# Patient Record
Sex: Male | Born: 1970 | Race: White | Hispanic: No | Marital: Single | Smoking: Never smoker
Health system: Southern US, Community
[De-identification: ages and names within clinical notes are randomized; demographics above are authoritative.]

## PROBLEM LIST (undated history)

## (undated) DIAGNOSIS — F419 Anxiety disorder, unspecified: Secondary | ICD-10-CM

## (undated) HISTORY — DX: Anxiety disorder, unspecified: F41.9

---

## 1998-05-20 ENCOUNTER — Emergency Department (HOSPITAL_COMMUNITY): Admission: EM | Admit: 1998-05-20 | Discharge: 1998-05-20 | Payer: Self-pay | Admitting: Emergency Medicine

## 2000-02-08 ENCOUNTER — Encounter: Admission: RE | Admit: 2000-02-08 | Discharge: 2000-02-08 | Payer: Self-pay | Admitting: *Deleted

## 2011-01-20 DIAGNOSIS — R7989 Other specified abnormal findings of blood chemistry: Secondary | ICD-10-CM | POA: Insufficient documentation

## 2011-03-30 ENCOUNTER — Other Ambulatory Visit: Payer: Self-pay | Admitting: Chiropractic Medicine

## 2011-03-30 DIAGNOSIS — M545 Low back pain, unspecified: Secondary | ICD-10-CM

## 2011-04-02 ENCOUNTER — Ambulatory Visit
Admission: RE | Admit: 2011-04-02 | Discharge: 2011-04-02 | Disposition: A | Discharge status: Home / Self Care | Payer: Medicare HMO | Source: Ambulatory Visit | Attending: Chiropractic Medicine | Admitting: Chiropractic Medicine

## 2011-04-02 DIAGNOSIS — M545 Low back pain, unspecified: Secondary | ICD-10-CM

## 2013-03-11 ENCOUNTER — Other Ambulatory Visit: Payer: Self-pay | Admitting: Orthopedic Surgery

## 2013-03-11 ENCOUNTER — Other Ambulatory Visit: Payer: Medicare HMO

## 2013-03-11 DIAGNOSIS — G8929 Other chronic pain: Secondary | ICD-10-CM

## 2013-03-25 ENCOUNTER — Ambulatory Visit
Admission: RE | Admit: 2013-03-25 | Discharge: 2013-03-25 | Disposition: A | Discharge status: Home / Self Care | Payer: 59 | Source: Ambulatory Visit | Attending: Orthopedic Surgery | Admitting: Orthopedic Surgery

## 2013-03-25 DIAGNOSIS — M25551 Pain in right hip: Secondary | ICD-10-CM

## 2013-03-25 DIAGNOSIS — G8929 Other chronic pain: Secondary | ICD-10-CM

## 2015-05-29 ENCOUNTER — Ambulatory Visit (INDEPENDENT_AMBULATORY_CARE_PROVIDER_SITE_OTHER): Payer: 59

## 2015-05-29 ENCOUNTER — Ambulatory Visit (INDEPENDENT_AMBULATORY_CARE_PROVIDER_SITE_OTHER): Payer: 59 | Admitting: Family Medicine

## 2015-05-29 VITALS — BP 118/80 | HR 75 | Temp 97.8°F | Resp 16 | Ht 69.5 in | Wt 160.8 lb

## 2015-05-29 DIAGNOSIS — R0781 Pleurodynia: Secondary | ICD-10-CM | POA: Diagnosis not present

## 2015-05-29 DIAGNOSIS — M940 Chondrocostal junction syndrome [Tietze]: Secondary | ICD-10-CM

## 2015-05-29 MED ORDER — CYCLOBENZAPRINE HCL ER 15 MG PO CP24: 15.0000 mg | ORAL_CAPSULE | Freq: Every day | ORAL | Status: DC | PRN

## 2015-05-29 MED ORDER — DICLOFENAC SODIUM 75 MG PO TBEC: 75.0000 mg | DELAYED_RELEASE_TABLET | Freq: Two times a day (BID) | ORAL | Status: DC

## 2015-05-29 NOTE — Progress Notes (Signed)
[facility] [address], [place] [address] [phone]  Date:  05/29/2015   Name:  Andre Sullivan   DOB:  1971/05/21   MRN:  [id number]  PCP:  No primary care provider on file.    Chief Complaint: Shortness of Breath and pain on left side   History of Present Illness:  This is a 44 y.o. male who is presenting with left anterior chest pain and SOB of breath since this morning. States yesterday he was mountain biking and took a fall. The left side of his chest fell into his handle bars pretty hard. He states the breath was knocked out of him for a few minutes but afterwards he was able to finish his bike ride without problems. This morning he woke for a tennis lesson. He did 20 minutes of forehand exercises and 10 minutes of overhead exercises without pain. He then went to hit a backhand and felt sudden sharp pain in his left chest and collapsed on the court d/t pain. He started having SOB but this has resolved now. He does note it is painful to take a deep breath. Also worse with raising his arm. He denies wheezing. He has not tried anything for pain so far.  Review of Systems:  Review of Systems See HPI  Patient Active Problem List   Diagnosis Date Noted  . 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 Penicillins Other (See Comments)    Unknown reaction    History reviewed. No pertinent past surgical history.  History  Substance Use Topics  . Smoking status: Never Smoker   . Smokeless tobacco: Not on file  . Alcohol Use: Not on file    History reviewed. No pertinent family history.  Medication list has been reviewed and updated.  Physical Examination:  Physical Exam  Constitutional: He is oriented to person, place, and time. He appears well-developed and well-nourished. No distress.  HENT:  Head: Normocephalic and atraumatic.  Right Ear: Hearing normal.    Left Ear: Hearing normal.  Nose: Nose normal.  Eyes: Conjunctivae and lids are normal. Right eye exhibits no discharge. Left eye exhibits no discharge. No scleral icterus.  Cardiovascular: Normal rate, regular rhythm, normal heart sounds and normal pulses.   No murmur heard. Pulmonary/Chest: Effort normal and breath sounds normal. No tachypnea. No respiratory distress. He has no decreased breath sounds. He has no wheezes. He has no rhonchi. He has no rales. He exhibits tenderness (left anterior chest, 2 inches under clavicle), bony tenderness and swelling (3 cm swelling over tender area). He exhibits no deformity.    No ecchymosis or deformity Generally tender over sternum  Abdominal: Soft. Normal appearance. There is no tenderness.  Neurological: He is alert and oriented to person, place, and time.  Skin: Skin is warm, dry and intact. No lesion and no rash noted.  Psychiatric: He has a normal mood and affect. His speech is normal and behavior is normal. Thought content normal.   BP 118/80 mmHg  Pulse 75  Temp(Src) 97.8 F (36.6 C) (Oral)  Resp 16  Ht 5' 9.5" (1.765 m)  Wt 160 lb 12.8 oz (72.938 kg)  BMI 23.41 kg/m2  SpO2 98%  [facility] reading (PRIMARY) by  Dr. Marin Comment: negative  Assessment and Plan:  1. Costochondritis 2. Rib pain on left side Radiograph negative. Vitals normal. Likely with costochondritis. Will treat  with anti-inflammatories and muscle relaxers. He did not want narcotics for pain. Encouraged to do deep breathing exercises throughout the day. Return if symptoms not improving in 1-2 weeks or at any time if SOB recurs. - diclofenac (VOLTAREN) 75 MG EC tablet; Take 1 tablet (75 mg total) by mouth 2 (two) times daily.  Dispense: 30 tablet; Refill: 0 - cyclobenzaprine (AMRIX) 15 MG 24 hr capsule; Take 1 capsule (15 mg total) by mouth daily as needed for muscle spasms.  Dispense: 30 capsule; Refill: 0 - DG Ribs Unilateral W/Chest Left; Future   Benjaman Pott. Drenda Freeze,  MHS [facility]  05/29/2015

## 2015-05-29 NOTE — Patient Instructions (Signed)
Take amrix at night. Take voltaren twice a day. Rest, ice and heat can help. I will call you if the radiologist report differs from mine. Return to activity as able. Deep breath exercises three times a day. Return if you develop shortness of breath or wheezing.

## 2015-05-31 NOTE — Progress Notes (Signed)
The recorded information has been reviewed and considered. Agree with A/P. Dr Marin Comment

## 2016-02-02 ENCOUNTER — Ambulatory Visit (INDEPENDENT_AMBULATORY_CARE_PROVIDER_SITE_OTHER): Payer: 59 | Admitting: Physician Assistant

## 2016-02-02 VITALS — BP 132/70 | HR 64 | Temp 97.8°F | Resp 18 | Ht 69.0 in | Wt 166.4 lb

## 2016-02-02 DIAGNOSIS — J069 Acute upper respiratory infection, unspecified: Secondary | ICD-10-CM

## 2016-02-02 DIAGNOSIS — M25551 Pain in right hip: Secondary | ICD-10-CM | POA: Diagnosis not present

## 2016-02-02 DIAGNOSIS — B9789 Other viral agents as the cause of diseases classified elsewhere: Principal | ICD-10-CM

## 2016-02-02 MED ORDER — DICLOFENAC SODIUM 1 % TD GEL: 4.0000 g | Freq: Four times a day (QID) | TRANSDERMAL | Status: AC | PRN

## 2016-02-02 MED ORDER — BENZONATATE 100 MG PO CAPS: 100.0000 mg | ORAL_CAPSULE | Freq: Three times a day (TID) | ORAL | Status: DC | PRN

## 2016-02-02 NOTE — Progress Notes (Signed)
[facility] [address], [place] [address] [phone]  Date:  02/02/2016   Name:  Andre Sullivan   DOB:  Dec 02, 1970   MRN:  [id number]  PCP:  No primary care provider on file.    Chief Complaint: Cough; Hip Pain; and Sinusitis   History of Present Illness:  This is a 45 y.o. male who is presenting with 3 days of cough and nasal congestion. States he woke 3 days ago with cough, sore throat and hoarseness. Cough is a mix of dry and productive. Denies sob or wheezing.  States he was sick for 6 weeks with sinus infection. Went to another UC 1 month ago and was treated with course of steroids and doxy. He states he did get back to 100% for a few weeks. Started back 3 days ago and he thinks it is the same thing. He is wondering if he never got better. He does note that he recently had a date with someone who had a cold.   Fever/chills: some chills last night, no fever Aggravating/alleviating factors: dayquil and nyquil History of asthma: as a child, no problems as adult. History of env allergies: no. But has been sick a lot this spring. Tobacco use: no  He does state that he goes hard with exercise. He play tennis, runs, does yoga and pilates. It is very hard for him to not exercise.  He is also complaining of right hip pain for the past 2 days. This is an intermittent problem for him. States he had a CT of his right hip in 2014 that showed mild right hip OA. Hurts at anterior hip and posterior hip. He went to pilates today to try to stretch it out.   Review of Systems:  Review of Systems See HPI   Patient Active Problem List   Diagnosis Date Noted  . 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 Penicillins Other (See Comments)    Unknown reaction    History reviewed. No pertinent past surgical history.  Social History  Substance Use Topics  . Smoking status:  Never Smoker   . Smokeless tobacco: None  . Alcohol Use: None    History reviewed. No pertinent family history.  Medication list has been reviewed and updated.  Physical Examination:  Physical Exam  Constitutional: He is oriented to person, place, and time. He appears well-developed and well-nourished. No distress.  HENT:  Head: Normocephalic and atraumatic.  Right Ear: Hearing, tympanic membrane, external ear and ear canal normal.  Left Ear: Hearing, tympanic membrane, external ear and ear canal normal.  Nose: Nose normal. Right sinus exhibits no maxillary sinus tenderness and no frontal sinus tenderness. Left sinus exhibits no maxillary sinus tenderness and no frontal sinus tenderness.  Mouth/Throat: Uvula is midline and mucous membranes are normal. Posterior oropharyngeal erythema present. No oropharyngeal exudate or posterior oropharyngeal edema.  Eyes: Conjunctivae and lids are normal. Right eye exhibits no discharge. Left eye exhibits no discharge. No scleral icterus.  Cardiovascular: Normal rate, regular rhythm, normal heart sounds and normal pulses.   No murmur heard. Pulmonary/Chest: Effort normal and breath sounds normal. No respiratory distress. He has no wheezes. He has no rhonchi. He has no rales.  Musculoskeletal: Normal range of motion.  Lymphadenopathy:       Head (right side): No submental, no submandibular and no tonsillar adenopathy present.  Head (left side): No submental, no submandibular and no tonsillar adenopathy present.    He has no cervical adenopathy.  Neurological: He is alert and oriented to person, place, and time.  Skin: Skin is warm, dry and intact. No lesion and no rash noted.  Psychiatric: He has a normal mood and affect. His speech is normal and behavior is normal. Thought content normal.   BP 132/70 mmHg  Pulse 64  Temp(Src) 97.8 F (36.6 C) (Oral)  Resp 18  Ht 5\' 9"  (1.753 m)  Wt 166 lb 6.4 oz (75.479 kg)  BMI 24.56 kg/m2  SpO2  98%  Assessment and Plan:  1. Viral URI with cough Sounds viral. Possibly allergic involvement. He will take tessalon for cough. Zyrtec QHS. Counseled on supportive care. He will let me know if not better in 1 week and I will rx abx. - benzonatate (TESSALON) 100 MG capsule; Take 1-2 capsules (100-200 mg total) by mouth 3 (three) times daily as needed for cough.  Dispense: 40 capsule; Refill: 0  2. Hip pain, right CT with just mild OA in right hip in 2014. This is ongoing problem for him. He wants to try voltaren gel. He will do that QID prn. Apply heat. Return if not getting better in 2 weeks and will obtain xrays. - diclofenac sodium (VOLTAREN) 1 % GEL; Apply 4 g topically 4 (four) times daily as needed.  Dispense: 100 g; Refill: 0   Benjaman Pott. Drenda Freeze, MHS [facility]  02/02/2016

## 2016-02-02 NOTE — Patient Instructions (Addendum)
Drink plenty of water (64 oz/day) and get plenty of rest. If you have been prescribed a cough syrup, do not drive or operate heavy machinery while using this medication. May take tessalon tabs during the day for cough Throat lozenges for sore throat. Take zyrtec at night in case this is allergies. If your symptoms are not improving in 1 week, let me know and I will prescribe antibiotics.  For your hips -- gentle stretching, ibuprofen 600 mg three times a day for next 5-7 days, rest, apply heat. Return in 2 weeks if not getting better and we can do xrays     IF you received an x-ray today, you will receive an invoice from [facility]. Please contact [facility] at [phone] with questions or concerns regarding your invoice.   IF you received labwork today, you will receive an invoice from [employer]. Please contact [facility] at [phone] with questions or concerns regarding your invoice.   Our billing staff will not be able to assist you with questions regarding bills from these companies.  You will be contacted with the lab results as soon as they are available. The fastest way to get your results is to activate your My Chart account. Instructions are located on the last page of this paperwork. If you have not heard from [place] regarding the results in 2 weeks, please contact this office.

## 2016-02-08 ENCOUNTER — Telehealth: Payer: Self-pay

## 2016-02-08 DIAGNOSIS — J209 Acute bronchitis, unspecified: Secondary | ICD-10-CM

## 2016-02-08 NOTE — Telephone Encounter (Signed)
Pt was seen by Andre Sullivan on 02/02/16 for Viral URI with cough - Primary. He isn't feeling any better and has a persistent cough. He would like to know what he should do? Please advise at  [phone]

## 2016-02-08 NOTE — Telephone Encounter (Signed)
It has not been a week but Andre Sullivan stated she would send in ABX if not better. See note

## 2016-02-09 MED ORDER — AZITHROMYCIN 250 MG PO TABS
ORAL_TABLET | ORAL | Status: AC
Start: 2016-02-09 — End: 2016-02-14

## 2016-02-09 NOTE — Telephone Encounter (Signed)
I sent in a zpak, like I discussed I would do if he wasn't feeling any better. He should continue the tessalon and drink plenty of water. He should make sure he is getting enough rest and not being too active, like we discussed at his visit.

## 2016-02-10 NOTE — Telephone Encounter (Signed)
Left message advising pt Rx sent in.

## 2017-01-21 ENCOUNTER — Encounter (INDEPENDENT_AMBULATORY_CARE_PROVIDER_SITE_OTHER): Payer: Self-pay | Admitting: Orthopaedic Surgery

## 2017-01-21 ENCOUNTER — Ambulatory Visit (INDEPENDENT_AMBULATORY_CARE_PROVIDER_SITE_OTHER): Payer: Self-pay

## 2017-01-21 ENCOUNTER — Ambulatory Visit (INDEPENDENT_AMBULATORY_CARE_PROVIDER_SITE_OTHER): Payer: BLUE CROSS/BLUE SHIELD | Admitting: Orthopaedic Surgery

## 2017-01-21 DIAGNOSIS — M25552 Pain in left hip: Secondary | ICD-10-CM | POA: Insufficient documentation

## 2017-01-21 DIAGNOSIS — M25551 Pain in right hip: Secondary | ICD-10-CM | POA: Diagnosis not present

## 2017-01-21 NOTE — Progress Notes (Signed)
   Office Visit Note   Patient: Andre Sullivan           Date of Birth: 1971/04/30           MRN: [id number] Visit Date: 01/21/2017              Requested by: No referring provider defined for this encounter. PCP: No PCP Per Patient   Assessment & Plan: Visit Diagnoses:  1. Bilateral hip pain     Plan: Patient likely has strain of his abdominals. I recommended 3-4 weeks of rest with scheduled NSAIDs for 2-3 weeks. Increase activity as tolerated. Would limit running to only about 5 miles per session. Questions encouraged and answered. Follow-up with me as needed.  Follow-Up Instructions: Return if symptoms worsen or fail to improve.   Orders:  Orders Placed This Encounter  Procedures  . XR Pelvis 1-2 Views   No orders of the defined types were placed in this encounter.     Procedures: No procedures performed   Clinical Data: No additional findings.   Subjective: Chief Complaint  Patient presents with  . Left Hip - Pain  . Right Hip - Pain    Patient is a 46 year old gentleman who I plate tennis with who comes in with anterior pubic pain is worse with abdominal workouts and running long distances.  He has not taken any medicines. Pain does radiate into the groin region. He is in really good shape and is very active.    Review of Systems  Constitutional: Negative.   All other systems reviewed and are negative.    Objective: Vital Signs: There were no vitals taken for this visit.  Physical Exam  Constitutional: He is oriented to person, place, and time. He appears well-developed and well-nourished.  HENT:  Head: Normocephalic and atraumatic.  Eyes: Pupils are equal, round, and reactive to light.  Neck: Neck supple.  Pulmonary/Chest: Effort normal.  Abdominal: Soft.  Musculoskeletal: Normal range of motion.  Neurological: He is alert and oriented to person, place, and time.  Skin: Skin is warm.  Psychiatric: He has a normal mood and affect. His  behavior is normal. Judgment and thought content normal.  Nursing note and vitals reviewed.   Ortho Exam Pelvic and hip exam shows no pain with rotation of the hips or with abduction of the leg. He has direct tenderness palpation just superior to the anterior pelvic brim. I do not appreciate any herniations in the inguinal region or in the abdominal region. Specialty Comments:  No specialty comments available.  Imaging: Xr Pelvis 1-2 Views  Result Date: 01/21/2017 No acute abnormalities. Mild joint space narrowing of bilateral hips    PMFS History: Patient Active Problem List   Diagnosis Date Noted  . Bilateral hip pain 01/21/2017  . 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  No family history on file.  No past surgical history on file. Social History   Occupational History  . Not on file.   Social History Main Topics  . Smoking status: Never Smoker  . Smokeless tobacco: Never Used  . Alcohol use Not on file  . Drug use: Unknown  . Sexual activity: Not on file

## 2019-09-06 ENCOUNTER — Emergency Department (HOSPITAL_COMMUNITY): Payer: BC Managed Care – PPO

## 2019-09-06 ENCOUNTER — Other Ambulatory Visit: Payer: Self-pay

## 2019-09-06 ENCOUNTER — Emergency Department (HOSPITAL_COMMUNITY)
Admission: EM | Admit: 2019-09-06 | Discharge: 2019-09-06 | Disposition: A | Discharge status: Home / Self Care | Payer: BC Managed Care – PPO | Attending: Emergency Medicine | Admitting: Emergency Medicine

## 2019-09-06 DIAGNOSIS — S61451A Open bite of right hand, initial encounter: Secondary | ICD-10-CM | POA: Insufficient documentation

## 2019-09-06 DIAGNOSIS — Z23 Encounter for immunization: Secondary | ICD-10-CM | POA: Insufficient documentation

## 2019-09-06 DIAGNOSIS — Y999 Unspecified external cause status: Secondary | ICD-10-CM | POA: Diagnosis not present

## 2019-09-06 DIAGNOSIS — W540XXA Bitten by dog, initial encounter: Secondary | ICD-10-CM | POA: Insufficient documentation

## 2019-09-06 DIAGNOSIS — Y9302 Activity, running: Secondary | ICD-10-CM | POA: Insufficient documentation

## 2019-09-06 DIAGNOSIS — Y92017 Garden or yard in single-family (private) house as the place of occurrence of the external cause: Secondary | ICD-10-CM | POA: Diagnosis not present

## 2019-09-06 MED ORDER — CLINDAMYCIN HCL 300 MG PO CAPS: 300.0000 mg | ORAL_CAPSULE | Freq: Three times a day (TID) | ORAL | 0 refills | Status: AC

## 2019-09-06 MED ORDER — CLINDAMYCIN HCL 300 MG PO CAPS: 300.0000 mg | ORAL_CAPSULE | Freq: Three times a day (TID) | ORAL | 0 refills | Status: DC

## 2019-09-06 MED ORDER — DOXYCYCLINE HYCLATE 100 MG PO TABS
100.0000 mg | ORAL_TABLET | Freq: Once | ORAL | Status: AC
  Administered 2019-09-06: 20:00:00 100 mg via ORAL
  Filled 2019-09-06: qty 1

## 2019-09-06 MED ORDER — DOXYCYCLINE HYCLATE 100 MG PO CAPS: 100.0000 mg | ORAL_CAPSULE | Freq: Two times a day (BID) | ORAL | 0 refills | Status: DC

## 2019-09-06 MED ORDER — IBUPROFEN 600 MG PO TABS: 600.0000 mg | ORAL_TABLET | Freq: Four times a day (QID) | ORAL | 0 refills | Status: DC | PRN

## 2019-09-06 MED ORDER — TETANUS-DIPHTH-ACELL PERTUSSIS 5-2.5-18.5 LF-MCG/0.5 IM SUSP
0.5000 mL | Freq: Once | INTRAMUSCULAR | Status: AC
  Administered 2019-09-06: 0.5 mL via INTRAMUSCULAR
  Filled 2019-09-06: qty 0.5

## 2019-09-06 MED ORDER — ACETAMINOPHEN 500 MG PO TABS
1000.0000 mg | ORAL_TABLET | Freq: Once | ORAL | Status: AC
  Administered 2019-09-06: 1000 mg via ORAL
  Filled 2019-09-06: qty 2

## 2019-09-06 MED ORDER — IBUPROFEN 600 MG PO TABS: 600.0000 mg | ORAL_TABLET | Freq: Four times a day (QID) | ORAL | 0 refills | Status: AC | PRN

## 2019-09-06 MED ORDER — CLINDAMYCIN HCL 300 MG PO CAPS
300.0000 mg | ORAL_CAPSULE | Freq: Once | ORAL | Status: AC
  Administered 2019-09-06: 300 mg via ORAL
  Filled 2019-09-06: qty 1

## 2019-09-06 MED ORDER — DOXYCYCLINE HYCLATE 100 MG PO CAPS: 100.0000 mg | ORAL_CAPSULE | Freq: Two times a day (BID) | ORAL | 0 refills | Status: AC

## 2019-09-06 NOTE — ED Provider Notes (Signed)
[facility] DEPT Provider Note   CSN: [id number] Arrival date & time: 09/06/19  [id number]     History   Chief Complaint Chief Complaint  Patient presents with  . Animal Bite    HPI Andre Sullivan is a 48 y.o. male presents today for evaluation of acute onset, progressively worsening pain and swelling to the right hand secondary to dog bite sustained around 9:30 AM this morning.  Patient reports that he was in the middle of his morning run when an ambulance passed by with sirens on causing a dog and a fence in the yard to become upset and agitated.  He states that he reflexively reached his right hand out to try to calm the dog down, but the dog then bit the dorsum of the right hand through the fence.  He spoke with the owners of the dog and confirmed that the dog is up-to-date on its immunizations.  He has 2 puncture wounds to the dorsum of the right hand.  He notes immediate onset of throbbing pain and swelling which has progressively worsened since.  He has cleaned the wound extensively with peroxide and water.  He is unsure if his tetanus is up-to-date.  Denies any numbness or tingling to the extremity.  No fevers.  He is right-hand dominant.     The history is provided by the patient.    No past medical history on file.  Patient Active Problem List   Diagnosis Date Noted  . Bilateral hip pain 01/21/2017  . 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      Home Medications    Prior to Admission medications   Medication Sig Start Date End Date Taking? Authorizing Provider  benzonatate (TESSALON) 100 MG capsule Take 1-2 capsules (100-200 mg total) by mouth 3 (three) times daily as needed for cough. Patient not taking: Reported on 01/21/2017 02/02/16   Ezekiel Slocumb, PA-C  clindamycin (CLEOCIN) 300 MG capsule Take 1 capsule (300 mg total) by mouth 3 (three) times daily for 7 days. 09/06/19 09/13/19  Rodell Perna A,  PA-C  diclofenac sodium (VOLTAREN) 1 % GEL Apply 4 g topically 4 (four) times daily as needed. Patient not taking: Reported on 01/21/2017 02/02/16   Ezekiel Slocumb, PA-C  doxycycline (VIBRAMYCIN) 100 MG capsule Take 1 capsule (100 mg total) by mouth 2 (two) times daily for 7 days. 09/06/19 09/13/19  Jadan Rouillard A, PA-C  ibuprofen (ADVIL) 600 MG tablet Take 1 tablet (600 mg total) by mouth every 6 (six) hours as needed. 09/06/19   Renita Papa, PA-C    Family History No family history on file.  Social History Social History   Tobacco Use  . Smoking status: Never Smoker  . Smokeless tobacco: Never Used  Substance Use Topics  . Alcohol use: Not on file  . Drug use: Not on file     Allergies   Penicillins   Review of Systems Review of Systems  Constitutional: Negative for fever.  Musculoskeletal: Positive for arthralgias.  Skin: Positive for wound.  Neurological: Negative for syncope and numbness.  All other systems reviewed and are negative.    Physical Exam Updated Vital Signs BP (!) 132/95 (BP Location: Left Arm)   Pulse 80   Temp 99.1 F (37.3 C) (Oral)   Resp 18   Ht 5\' 9"  (1.753 m)   Wt 73.5 kg   SpO2 98%   BMI 23.92 kg/m  Physical Exam Vitals signs and nursing note reviewed.  Constitutional:      General: He is not in acute distress.    Appearance: He is well-developed.     Comments: Resting comfortably in chair  HENT:     Head: Normocephalic and atraumatic.  Eyes:     General:        Right eye: No discharge.        Left eye: No discharge.     Conjunctiva/sclera: Conjunctivae normal.  Neck:     Vascular: No JVD.     Trachea: No tracheal deviation.  Cardiovascular:     Rate and Rhythm: Normal rate.     Pulses: Normal pulses.     Comments: 2+ radial pulses bilaterally Pulmonary:     Effort: Pulmonary effort is normal.  Abdominal:     General: There is no distension.  Musculoskeletal:        General: Swelling and tenderness present.      Comments: See below images.  Patient with swelling and erythema to the dorsum of the right hand extending into the wrist.  He has tenderness to palpation overlying the areas of swelling.  He has 2 puncture wounds consistent with history of dog bite to the dorsum of the hand.  He has some difficulty forming a fist and with opposition of the right thumb to the right pinky finger but is able to do so.  5/5 strength of wrist and digits with flexion and extension against resistance.  No drainage from the wounds.  Skin:    General: Skin is warm and dry.     Findings: No erythema.  Neurological:     Mental Status: He is alert.     Comments: Sensation intact to light touch of bilateral hands.  Psychiatric:        Behavior: Behavior normal.          ED Treatments / Results  Labs (all labs ordered are listed, but only abnormal results are displayed) Labs Reviewed - No data to display  EKG None  Radiology Dg Hand Complete Right  Result Date: 09/06/2019 CLINICAL DATA:  Dog bite to right hand, proximal 1st metatarsal. EXAM: RIGHT HAND - COMPLETE 3+ VIEW COMPARISON:  None. FINDINGS: There is no evidence of fracture or dislocation. There is no evidence of arthropathy or other focal bone abnormality. No radiopaque foreign body identified. IMPRESSION: No acute osseous abnormality in the right hand. No radiopaque foreign body identified. Electronically Signed   By: Audie Pinto M.D.   On: 09/06/2019 19:16    Procedures Procedures (including critical care time)  Medications Ordered in ED Medications  Tdap (BOOSTRIX) injection 0.5 mL (0.5 mLs Intramuscular Given 09/06/19 1959)  doxycycline (VIBRA-TABS) tablet 100 mg (100 mg Oral Given 09/06/19 1959)  clindamycin (CLEOCIN) capsule 300 mg (300 mg Oral Given 09/06/19 1959)  acetaminophen (TYLENOL) tablet 1,000 mg (1,000 mg Oral Given 09/06/19 1959)     Initial Impression / Assessment and Plan / ED Course  I have reviewed the triage vital signs  and the nursing notes.  Pertinent labs & imaging results that were available during my care of the patient were reviewed by me and considered in my medical decision making (see chart for details).        Patient presenting for evaluation of right hand pain and swelling after dog bite this morning.  The animal is up-to-date on its immunizations and is observable, currently with owners.  Animal control was notified.  Patient is  afebrile, vital signs are stable.  He is nontoxic, nonseptic in appearance.  He is neurovascularly intact.  He is some difficulty forming a fist due to pain and swelling but has full opposition of the thumb with other digits.  2 puncture wounds noted to the dorsum of the right hand.  No active drainage noted.  The wound was extensively cleaned.  His tetanus was updated.  He is penicillin allergic since childhood, unsure of his reaction.  Will start on a course of doxycycline and clindamycin. Erythema margins marked with skin pen.  Recommend follow up with PCP or hand surgeon for re-evaluation, but discussed strict indications to return to ED if any signs of worsening infection. Patient verbalized understanding of and agreement with plan and is safe for discharge home at this time.   Final Clinical Impressions(s) / ED Diagnoses   Final diagnoses:  Dog bite of right hand, initial encounter    ED Discharge Orders         Ordered    doxycycline (VIBRAMYCIN) 100 MG capsule  2 times daily,   Status:  Discontinued     09/06/19 2010    clindamycin (CLEOCIN) 300 MG capsule  3 times daily,   Status:  Discontinued     09/06/19 2010    ibuprofen (ADVIL) 600 MG tablet  Every 6 hours PRN,   Status:  Discontinued     09/06/19 2010    clindamycin (CLEOCIN) 300 MG capsule  3 times daily     09/06/19 2011    doxycycline (VIBRAMYCIN) 100 MG capsule  2 times daily     09/06/19 2011    ibuprofen (ADVIL) 600 MG tablet  Every 6 hours PRN     09/06/19 2011           Renita Papa,  PA-C 09/08/19 [id number]    Isla Pence, MD 09/09/19 0730

## 2019-09-06 NOTE — ED Notes (Signed)
Skin pen used to draw line around redness/swelling.

## 2019-09-06 NOTE — ED Notes (Signed)
An After Visit Summary was printed and given to the patient. Discharge instructions given and patient has no further questions at this time.

## 2019-09-06 NOTE — Discharge Instructions (Signed)
Please take all of your antibiotics until finished!   Take your antibiotics with food.  Common side effects of antibiotics include nausea, vomiting, abdominal discomfort, and diarrhea. You may help offset some of this with probiotics which you can buy or get in yogurt. Do not eat  or take the probiotics until 2 hours after your antibiotic.    You can take 1 to 2 tablets of Tylenol (350mg -1000mg  depending on the dose) every 6 hours as needed for pain.  Do not exceed 4000 mg of Tylenol daily.  If your pain persists you can take a doses of ibuprofen in between doses of Tylenol.  I usually recommend 400 to 600 mg of ibuprofen every 6 hours.  Take this with food to avoid upset stomach issues.  Keep the wounds clean and dry.  Monitor for signs of worsening infection and return to the emergency department if any concerning signs or symptoms develop such as fevers, abnormal drainage, numbness or weakness of the hand, spread of redness or streaking of redness up the arm.  You can follow-up with hand surgery as needed on an outpatient basis as well.

## 2019-09-06 NOTE — ED Notes (Signed)
Pt right hand soaked in betadine and flushed with NS.

## 2019-09-06 NOTE — ED Triage Notes (Signed)
Arrived by POV with c/o dog bite to right hand. Patient reports he was trying to calm a dog that he wasn't familiar with when he was  Bitten. Patient has confirmed that rabies vaccine is up to date for dog. Patient say swelling and pain in hand is increasing. Hand is red and swollen

## 2021-04-07 ENCOUNTER — Encounter: Payer: Self-pay | Admitting: Gastroenterology

## 2021-06-01 ENCOUNTER — Other Ambulatory Visit: Payer: Self-pay

## 2021-06-01 ENCOUNTER — Ambulatory Visit (AMBULATORY_SURGERY_CENTER): Payer: Self-pay | Admitting: *Deleted

## 2021-06-01 VITALS — Ht 69.0 in | Wt 160.2 lb

## 2021-06-01 DIAGNOSIS — Z1211 Encounter for screening for malignant neoplasm of colon: Secondary | ICD-10-CM

## 2021-06-01 NOTE — Progress Notes (Signed)
Pt has never had surgery- denies trouble moving neck   No egg or soy allergy  No home oxygen use   No medications for weight loss taken  emmi information given  Pt denies constipation issues

## 2021-06-13 ENCOUNTER — Encounter: Payer: Self-pay | Admitting: Gastroenterology

## 2021-06-14 ENCOUNTER — Encounter: Payer: Self-pay | Admitting: Certified Registered Nurse Anesthetist

## 2021-06-15 ENCOUNTER — Encounter: Payer: Self-pay | Admitting: Gastroenterology

## 2021-06-15 ENCOUNTER — Other Ambulatory Visit: Payer: Self-pay

## 2021-06-15 ENCOUNTER — Ambulatory Visit (AMBULATORY_SURGERY_CENTER): Payer: BC Managed Care – PPO | Admitting: Gastroenterology

## 2021-06-15 VITALS — BP 141/92 | HR 65 | Temp 98.0°F | Resp 13 | Ht 69.0 in | Wt 160.2 lb

## 2021-06-15 DIAGNOSIS — D123 Benign neoplasm of transverse colon: Secondary | ICD-10-CM

## 2021-06-15 DIAGNOSIS — K6289 Other specified diseases of anus and rectum: Secondary | ICD-10-CM

## 2021-06-15 DIAGNOSIS — Z1211 Encounter for screening for malignant neoplasm of colon: Secondary | ICD-10-CM

## 2021-06-15 DIAGNOSIS — D122 Benign neoplasm of ascending colon: Secondary | ICD-10-CM

## 2021-06-15 DIAGNOSIS — K635 Polyp of colon: Secondary | ICD-10-CM | POA: Diagnosis not present

## 2021-06-15 MED ORDER — SODIUM CHLORIDE 0.9 % IV SOLN: 500.0000 mL | Freq: Once | INTRAVENOUS | Status: DC

## 2021-06-15 NOTE — Op Note (Signed)
[facility] Patient Name: Andre Sullivan Procedure Date: 06/15/2021 10:38 AM MRN: [id number] Endoscopist: Thornton Park MD, MD Age: 50 Referring MD:  Date of Birth: 10-17-1971 Gender: Male Account #: [id number] Procedure:                Colonoscopy Indications:              Screening for colorectal malignant neoplasm, This                            is the patient's first colonoscopy                           No known family history of colon cancer or polyps Medicines:                Monitored Anesthesia Care Procedure:                Pre-Anesthesia Assessment:                           - Prior to the procedure, a History and Physical                            was performed, and patient medications and                            allergies were reviewed. The patient's tolerance of                            previous anesthesia was also reviewed. The risks                            and benefits of the procedure and the sedation                            options and risks were discussed with the patient.                            All questions were answered, and informed consent                            was obtained. Prior Anticoagulants: The patient has                            taken no previous anticoagulant or antiplatelet                            agents. ASA Grade Assessment: I - A normal, healthy                            patient. After reviewing the risks and benefits,                            the patient was deemed in satisfactory condition to  undergo the procedure.                           After obtaining informed consent, the colonoscope                            was passed under direct vision. Throughout the                            procedure, the patient's blood pressure, pulse, and                            oxygen saturations were monitored continuously. The                            Olympus CF-HQ190L ([id number])  Colonoscope was                            introduced through the anus and advanced to the 3                            cm into the ileum. The colonoscopy was performed                            without difficulty. The patient tolerated the                            procedure well. The quality of the bowel                            preparation was excellent. The terminal ileum,                            ileocecal valve, appendiceal orifice, and rectum                            were photographed. Scope In: 10:57:17 AM Scope Out: 11:12:09 AM Scope Withdrawal Time: 0 hours 11 minutes 51 seconds  Total Procedure Duration: 0 hours 14 minutes 52 seconds  Findings:                 The perianal and digital rectal examinations were                            normal.                           Three sessile polyps were found in the transverse                            colon, hepatic flexure and ascending colon. The                            polyps were 2 to 4 mm in size. These polyps were  removed with a cold snare. Resection and retrieval                            were complete. Estimated blood loss was minimal.                           A patchy area of mildly erythematous mucosa was                            found in the rectum. Biopsies were taken with a                            cold forceps for histology. Estimated blood loss                            was minimal.                           The exam was otherwise without abnormality on                            direct and retroflexion views. Complications:            No immediate complications. Estimated blood loss:                            Minimal. Estimated Blood Loss:     Estimated blood loss was minimal. Impression:               - Three 2 to 4 mm polyps in the transverse colon,                            at the hepatic flexure and in the ascending colon,                            removed with a  cold snare. Resected and retrieved.                           - Erythematous mucosa in the rectum. Biopsied.                           - The examination was otherwise normal on direct                            and retroflexion views. Recommendation:           - Patient has a contact number available for                            emergencies. The signs and symptoms of potential                            delayed complications were discussed with the  patient. Return to normal activities tomorrow.                            Written discharge instructions were provided to the                            patient.                           - Resume previous diet.                           - Continue present medications.                           - Await pathology results.                           - Repeat colonoscopy date to be determined after                            pending pathology results are reviewed for                            surveillance.                           - Emerging evidence supports eating a diet of                            fruits, vegetables, grains, calcium, and yogurt                            while reducing red meat and alcohol may reduce the                            risk of colon cancer.                           - Thank you for allowing me to be involved in your                            colon cancer prevention. Thornton Park MD, MD 06/15/2021 11:22:29 AM This report has been signed electronically.

## 2021-06-15 NOTE — Progress Notes (Signed)
   Referring Provider: [facility] Primary Care Physician:  [facility]  Reason for Consultation:  Screening colonoscopy   IMPRESSION:  Need for colon cancer screening  PLAN: Colonoscopy  Please see the "Patient Instructions" section for addition details about the plan.  HPI: Andre Sullivan is a 50 y.o. male referred for colonoscopy for colon cancer screening.  No prior colonoscopy or colon cancer screening.  No baseline GI symptoms.   No known family history of colon cancer or polyps although father's history is unknown. No family history of uterine/endometrial cancer, pancreatic cancer or gastric/stomach cancer.   Past Medical History:  Diagnosis Date   Anxiety     History reviewed. No pertinent surgical history.  Current Outpatient Medications  Medication Sig Dispense Refill   ALPRAZolam (XANAX) 0.25 MG tablet Take 0.25 mg by mouth daily as needed for anxiety.     diclofenac sodium (VOLTAREN) 1 % GEL Apply 4 g topically 4 (four) times daily as needed. 100 g 0   ibuprofen (ADVIL) 600 MG tablet Take 1 tablet (600 mg total) by mouth every 6 (six) hours as needed. 30 tablet 0   zolpidem (AMBIEN) 10 MG tablet Take 10 mg by mouth at bedtime as needed for sleep (takes 1/2 of a '10mg'$  tablet prn).     benzonatate (TESSALON) 100 MG capsule Take 1-2 capsules (100-200 mg total) by mouth 3 (three) times daily as needed for cough. 40 capsule 0   valACYclovir (VALTREX) 1000 MG tablet Take 1 tablet by mouth 2 (two) times daily. Takes PRN     Current Facility-Administered Medications  Medication Dose Route Frequency Provider Last Rate Last Admin   0.9 %  sodium chloride infusion  500 mL Intravenous Once Thornton Park, MD        Allergies as of 06/15/2021 - Review Complete 06/15/2021  Allergen Reaction Noted   Penicillins Other (See Comments) 01/13/2014    Family History  Problem Relation Age of Onset   Colon cancer  Neg Hx    Esophageal cancer Neg Hx    Rectal cancer Neg Hx    Stomach cancer Neg Hx       Physical Exam: General:   Alert,  well-nourished, pleasant and cooperative in NAD Head:  Normocephalic and atraumatic. Eyes:  Sclera clear, no icterus.   Conjunctiva pink. Ears:  Normal auditory acuity. Nose:  No deformity, discharge,  or lesions. Mouth:  No deformity or lesions.   Neck:  Supple; no masses or thyromegaly. Lungs:  Clear throughout to auscultation.   No wheezes. Heart:  Regular rate and rhythm; no murmurs. Abdomen:  Soft, nontender, nondistended, normal bowel sounds, no rebound or guarding. No hepatosplenomegaly.   Rectal:  Deferred  Msk:  Symmetrical. No boney deformities LAD: No inguinal or umbilical LAD Extremities:  No clubbing or edema. Neurologic:  Alert and  oriented x4;  grossly nonfocal Skin:  Intact without significant lesions or rashes. Psych:  Alert and cooperative. Normal mood and affect.    Andre Heckart L. Tarri Glenn, MD, MPH 06/15/2021, 10:44 AM

## 2021-06-15 NOTE — Progress Notes (Signed)
Report given to PACU, vss 

## 2021-06-15 NOTE — Patient Instructions (Signed)
Handout provided on polyps.   YOU HAD AN ENDOSCOPIC PROCEDURE TODAY AT THE [facility]:   Refer to the procedure report that was given to you for any specific questions about what was found during the examination.  If the procedure report does not answer your questions, please call your gastroenterologist to clarify.  If you requested that your care partner not be given the details of your procedure findings, then the procedure report has been included in a sealed envelope for you to review at your convenience later.  YOU SHOULD EXPECT: Some feelings of bloating in the abdomen. Passage of more gas than usual.  Walking can help get rid of the air that was put into your GI tract during the procedure and reduce the bloating. If you had a lower endoscopy (such as a colonoscopy or flexible sigmoidoscopy) you may notice spotting of blood in your stool or on the toilet paper. If you underwent a bowel prep for your procedure, you may not have a normal bowel movement for a few days.  Please Note:  You might notice some irritation and congestion in your nose or some drainage.  This is from the oxygen used during your procedure.  There is no need for concern and it should clear up in a day or so.  SYMPTOMS TO REPORT IMMEDIATELY:  Following lower endoscopy (colonoscopy or flexible sigmoidoscopy):  Excessive amounts of blood in the stool  Significant tenderness or worsening of abdominal pains  Swelling of the abdomen that is new, acute  Fever of 100F or higher  For urgent or emergent issues, a gastroenterologist can be reached at any hour by calling [phone]. Do not use MyChart messaging for urgent concerns.    DIET:  We do recommend a small meal at first, but then you may proceed to your regular diet.  Drink plenty of fluids but you should avoid alcoholic beverages for 24 hours.  ACTIVITY:  You should plan to take it easy for the rest of today and you should NOT DRIVE or use heavy  machinery until tomorrow (because of the sedation medicines used during the test).    FOLLOW UP: Our staff will call the number listed on your records 48-72 hours following your procedure to check on you and address any questions or concerns that you may have regarding the information given to you following your procedure. If we do not reach you, we will leave a message.  We will attempt to reach you two times.  During this call, we will ask if you have developed any symptoms of COVID 19. If you develop any symptoms (ie: fever, flu-like symptoms, shortness of breath, cough etc.) before then, please call [phone].  If you test positive for Covid 19 in the 2 weeks post procedure, please call and report this information to us.    If any biopsies were taken you will be contacted by phone or by letter within the next 1-3 weeks.  Please call us at [phone] if you have not heard about the biopsies in 3 weeks.    SIGNATURES/CONFIDENTIALITY: You and/or your care partner have signed paperwork which will be entered into your electronic medical record.  These signatures attest to the fact that that the information above on your After Visit Summary has been reviewed and is understood.  Full responsibility of the confidentiality of this discharge information lies with you and/or your care-partner.  

## 2021-06-15 NOTE — Progress Notes (Signed)
Called to room to assist during endoscopic procedure.  Patient ID and intended procedure confirmed with present staff. Received instructions for my participation in the procedure from the performing physician.  

## 2021-06-15 NOTE — Progress Notes (Signed)
Pt's states no medical or surgical changes since previsit or office visit. 

## 2021-06-19 ENCOUNTER — Telehealth: Payer: Self-pay

## 2021-06-19 NOTE — Telephone Encounter (Signed)
  Follow up Call-  Call back number 06/15/2021  Post procedure Call Back phone  # [phone]  Permission to leave phone message Yes  Some recent data might be hidden     Patient questions:  Do you have a fever, pain , or abdominal swelling? No. Pain Score  0 *  Have you tolerated food without any problems? Yes.    Have you been able to return to your normal activities? Yes.    Do you have any questions about your discharge instructions: Diet   No. Medications  No. Follow up visit  No.  Do you have questions or concerns about your Care? No.  Actions: * If pain score is 4 or above: No action needed, pain <4.  Have you developed a fever since your procedure? no  2.   Have you had an respiratory symptoms (SOB or cough) since your procedure? no  3.   Have you tested positive for COVID 19 since your procedure no  4.   Have you had any family members/close contacts diagnosed with the COVID 19 since your procedure?  no   If yes to any of these questions please route to Joylene John, RN and Joella Prince, RN

## 2021-06-20 ENCOUNTER — Encounter: Payer: Self-pay | Admitting: Gastroenterology

## 2021-06-30 ENCOUNTER — Telehealth: Payer: BC Managed Care – PPO | Admitting: Physician Assistant

## 2021-06-30 DIAGNOSIS — U071 COVID-19: Secondary | ICD-10-CM

## 2021-06-30 MED ORDER — FLUTICASONE PROPIONATE 50 MCG/ACT NA SUSP: 2.0000 | Freq: Every day | NASAL | 0 refills | Status: AC

## 2021-06-30 MED ORDER — ALBUTEROL SULFATE HFA 108 (90 BASE) MCG/ACT IN AERS: 2.0000 | INHALATION_SPRAY | Freq: Four times a day (QID) | RESPIRATORY_TRACT | 0 refills | Status: AC | PRN

## 2021-06-30 MED ORDER — BENZONATATE 100 MG PO CAPS: 100.0000 mg | ORAL_CAPSULE | Freq: Three times a day (TID) | ORAL | 0 refills | Status: AC | PRN

## 2021-06-30 NOTE — Progress Notes (Signed)
  E-Visit  for Positive Covid Test Result  We are sorry you are not feeling well. We are here to help!  You have tested positive for COVID-19, meaning that you were infected with the novel coronavirus and could give the virus to others.  It is vitally important that you stay home so you do not spread it to others.      Please continue isolation at home, for at least 10 days since the start of your symptoms and until you have had 24 hours with no fever (without taking a fever reducer) and with improving of symptoms.  If you have no symptoms but tested positive (or all symptoms resolve after 5 days and you have no fever) you can leave your house but continue to wear a mask around others for an additional 5 days. If you have a fever,continue to stay home until you have had 24 hours of no fever. Most cases improve 5-10 days from onset but we have seen a small number of patients who have gotten worse after the 10 days.  Please be sure to watch for worsening symptoms and remain taking the proper precautions.   Go to the nearest hospital ED for assessment if fever/cough/breathlessness are severe or illness seems like a threat to life.    The following symptoms may appear 2-14 days after exposure: Fever Cough Shortness of breath or difficulty breathing Chills Repeated shaking with chills Muscle pain Headache Sore throat New loss of taste or smell Fatigue Congestion or runny nose Nausea or vomiting Diarrhea  You have been enrolled in MyChart Home Monitoring for COVID-19. Daily you will receive a questionnaire within the MyChart website. Our COVID-19 response team will be monitoring your responses daily.  You can use medication such as prescription cough medication called Tessalon Perles 100 mg. You may take 1-2 capsules every 8 hours as needed for cough,  prescription inhaler called Albuterol MDI 90 mcg /actuation 2 puffs every 4 hours as needed for shortness of breath, wheezing, cough, and  prescription for Fluticasone nasal spray 2 sprays in each nostril one time per day  You may also take acetaminophen (Tylenol) as needed for fever.  HOME CARE: Only take medications as instructed by your medical team. Drink plenty of fluids and get plenty of rest. A steam or ultrasonic humidifier can help if you have congestion.   GET HELP RIGHT AWAY IF YOU HAVE EMERGENCY WARNING SIGNS.  Call 911 or proceed to your closest emergency facility if: You develop worsening high fever. Trouble breathing Bluish lips or face Persistent pain or pressure in the chest New confusion Inability to wake or stay awake You cough up blood. Your symptoms become more severe Inability to hold down food or fluids  This list is not all possible symptoms. Contact your medical provider for any symptoms that are severe or concerning to you.    Your e-visit answers were reviewed by a board certified advanced clinical practitioner to complete your personal care plan.  Depending on the condition, your plan could have included both over the counter or prescription medications.  If there is a problem please reply once you have received a response from your provider.  Your safety is important to us.  If you have drug allergies check your prescription carefully.    You can use MyChart to ask questions about today's visit, request a non-urgent call back, or ask for a work or school excuse for 24 hours related to this e-Visit. If it has   been greater than 24 hours you will need to follow up with your provider, or enter a new e-Visit to address those concerns. You will get an e-mail in the next two days asking about your experience.  I hope that your e-visit has been valuable and will speed your recovery. Thank you for using e-visits.     I provided 6 minutes of non face-to-face time during this encounter for chart review and documentation.

## 2021-07-04 ENCOUNTER — Ambulatory Visit: Payer: Self-pay

## 2021-07-04 ENCOUNTER — Ambulatory Visit (INDEPENDENT_AMBULATORY_CARE_PROVIDER_SITE_OTHER): Payer: BC Managed Care – PPO

## 2021-07-04 ENCOUNTER — Other Ambulatory Visit: Payer: Self-pay

## 2021-07-04 ENCOUNTER — Telehealth: Payer: Self-pay

## 2021-07-04 DIAGNOSIS — M25521 Pain in right elbow: Secondary | ICD-10-CM

## 2021-07-04 NOTE — Telephone Encounter (Signed)
I evaluated Andre Sullivan this past weekend.  He has a 33-monthhistory of right elbow pain lateralizing to the lateral condyle.  He is an avid tFirefighter  Denies any neck pain or radicular symptoms.  He has tried ice heat counterforce bracing along with stretching and strengthening.  He has also tried activity modification and activity avoidance.  Over the past 2 months none of these interventions have been successful.  Reports pain with gripping as well as pain with activities of daily living and pain which wakes him from sleep at night.  The pain localizes to the lateral epicondyle region and radiates about 1 handbreadth down but he does not report any tenderness or pain in the radial tunnel posterior interosseous nerve region.  He has taken over-the-counter medication without relief.  Plan at this time is plain radiographs to be obtained and an MRI scan with likely PRP injection to follow with complete tennis avoidance for approximately 2 to 3 months.  I encouraged him to continue aerobic exercise as well as stretching and eccentric strengthening once his pain improves.  Would not recommend cortisone injection at this time.

## 2021-07-04 NOTE — Telephone Encounter (Signed)
MR right elbow ordered per message.

## 2021-07-07 ENCOUNTER — Other Ambulatory Visit: Payer: Self-pay

## 2021-07-07 ENCOUNTER — Ambulatory Visit
Admission: RE | Admit: 2021-07-07 | Discharge: 2021-07-07 | Disposition: A | Discharge status: Home / Self Care | Payer: BC Managed Care – PPO | Source: Ambulatory Visit | Attending: Orthopedic Surgery | Admitting: Orthopedic Surgery

## 2021-07-07 DIAGNOSIS — M25521 Pain in right elbow: Secondary | ICD-10-CM

## 2021-07-09 NOTE — Progress Notes (Signed)
Hi Lauren.  I talked to [name] last night about his scan.  Can we get him set up for PRP injection with Arthrex.  Please call Soyla Dryer and he will get it set up.  Sometime next week would be ideal.  Thank you

## 2021-07-12 ENCOUNTER — Ambulatory Visit: Payer: Self-pay

## 2021-07-12 ENCOUNTER — Other Ambulatory Visit: Payer: Self-pay

## 2021-07-12 ENCOUNTER — Ambulatory Visit: Payer: BC Managed Care – PPO | Admitting: Orthopedic Surgery

## 2021-07-12 DIAGNOSIS — M7711 Lateral epicondylitis, right elbow: Secondary | ICD-10-CM | POA: Diagnosis not present

## 2021-07-12 DIAGNOSIS — M25521 Pain in right elbow: Secondary | ICD-10-CM

## 2021-07-12 MED ORDER — TRAMADOL HCL ER 100 MG PO TB24: ORAL_TABLET | ORAL | 0 refills | Status: AC

## 2021-07-12 MED ORDER — IBUPROFEN 800 MG PO TABS: 800.0000 mg | ORAL_TABLET | Freq: Three times a day (TID) | ORAL | 0 refills | Status: DC | PRN

## 2021-07-16 ENCOUNTER — Encounter: Payer: Self-pay | Admitting: Orthopedic Surgery

## 2021-07-16 NOTE — Progress Notes (Signed)
   Procedure Note  Patient: Dilyn Roaden             Date of Birth: 02-Dec-1970           MRN: [id number]             Visit Date: 07/12/2021  Procedures: Visit Diagnoses:  1. Pain in right elbow   Coralyn Mark is a 50 year old patient with right elbow pain.  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  He has tried activity modification and strengthening stretching medication and bracing.  None of those have been effective.  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  Presents now for PRP injection for further management of his elbow symptoms.  Symptoms did become acutely worse over the past 2 months but has been present for a month prior.  For 3 months total.  Hand/UE Inj: R elbow for lateral epicondylitis on 07/12/2021 2:28 PM Indications: therapeutic Details: 22 G needle, lateral approach Outcome: tolerated well, no immediate complications Procedure, treatment alternatives, risks and benefits explained, specific risks discussed. Immediately prior to procedure a time out was called to verify the correct patient, procedure, equipment, support staff and site/side marked as required. Patient was prepped and draped in the usual sterile fashion.   Post injection the plan is for 6 weeks of relative rest followed by increasing activity focusing on eccentric strengthening and stretching.  Anticipate some return to tennis in the 32-monthrange.  Cardio exercise encouraged.  He has been doing a lot of fitness type exercises.

## 2021-08-01 ENCOUNTER — Telehealth: Payer: Self-pay

## 2021-08-01 NOTE — Telephone Encounter (Addendum)
Approval number : [id number] 07/02/2021-08/01/2022

## 2021-08-01 NOTE — Telephone Encounter (Signed)
Attempted to contact patient however had to leave a voicemail. Informed patient that tramadol medication was approved though CoverMyMeds and I was needing to know which pharmacy he was needing this sent to.

## 2022-03-06 ENCOUNTER — Ambulatory Visit: Payer: BC Managed Care – PPO | Admitting: Orthopaedic Surgery

## 2022-03-06 ENCOUNTER — Ambulatory Visit (INDEPENDENT_AMBULATORY_CARE_PROVIDER_SITE_OTHER): Payer: BC Managed Care – PPO

## 2022-03-06 ENCOUNTER — Encounter: Payer: Self-pay | Admitting: Orthopaedic Surgery

## 2022-03-06 VITALS — Ht 69.0 in | Wt 170.0 lb

## 2022-03-06 DIAGNOSIS — M25561 Pain in right knee: Secondary | ICD-10-CM

## 2022-03-06 MED ORDER — CELECOXIB 200 MG PO CAPS: 200.0000 mg | ORAL_CAPSULE | Freq: Two times a day (BID) | ORAL | 3 refills | Status: AC

## 2022-03-06 NOTE — Progress Notes (Signed)
? ?Office Visit Note ?  ?Patient: Andre Sullivan           ?Date of Birth: [birth year]           ?MRN: [id number] ?Visit Date: 03/06/2022 ?             ?Requested by: No referring provider defined for this encounter. ?PCP: Patient, No Pcp Per (Inactive) ? ? ?Assessment & Plan: ?Visit Diagnoses:  ?1. Acute pain of right knee   ? ? ?Plan: Based on findings I feel that he has overuse injury related to his hamstrings possibly from overcompensation from the IT band.  Low suspicion for intra-articular derangement.  Recommend relative rest and activity modification for 4 weeks combined with Celebrex which I have sent in.  He will let me know if he continues to be symptomatic at which point we would obtain MRI. ? ?Follow-Up Instructions: No follow-ups on file.  ? ?Orders:  ?Orders Placed This Encounter  ?Procedures  ? XR KNEE 3 VIEW RIGHT  ? ?Meds ordered this encounter  ?Medications  ? celecoxib (CELEBREX) 200 MG capsule  ?  Sig: Take 1 capsule (200 mg total) by mouth 2 (two) times daily.  ?  Dispense:  30 capsule  ?  Refill:  3  ? ? ? ? Procedures: ?No procedures performed ? ? ?Clinical Data: ?No additional findings. ? ? ?Subjective: ?Chief Complaint  ?Patient presents with  ? Right Knee - Pain  ? ? ?HPI ? 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  Pain is acutely worse after playing last night on hard courts.  He has pain when he is in a semisquatted position while playing tennis.  The pain is localized to the posterior lateral aspect.  Denies any mechanical symptoms or swelling.  Has not taken any NSAIDs or undergone any significant periods of rest. ? ?Review of Systems  ?Constitutional: Negative.   ?All other systems reviewed and are negative. ? ? ?Objective: ?Vital Signs: Ht '5\' 9"'$  (1.753 m)   Wt 170 lb (77.1 kg)   BMI 25.10 kg/m?  ? ?Physical Exam ?Vitals and nursing note reviewed.  ?Constitutional:   ?   Appearance: He is  well-developed.  ?Pulmonary:  ?   Effort: Pulmonary effort is normal.  ?Abdominal:  ?   Palpations: Abdomen is soft.  ?Skin: ?   General: Skin is warm.  ?Neurological:  ?   Mental Status: He is alert and oriented to person, place, and time.  ?Psychiatric:     ?   Behavior: Behavior normal.     ?   Thought Content: Thought content normal.     ?   Judgment: Judgment normal.  ? ? ?Ortho Exam ? ?Examination of the right knee shows no joint effusion.  No lateral joint line tenderness.  Posterior lateral corner is stable.  Collaterals and cruciates are stable.  Full range of motion.  No palpable popliteal masses.  No significant pain with passive stretch of the calf or with active contraction of the calf.  He feels pain along the biceps femoris muscle belly as well as the tendon.  Slightly tender along the IT band. ? ?Specialty Comments:  ?No specialty comments available. ? ?Imaging: ?XR KNEE 3 VIEW RIGHT ? ?Result Date: 03/06/2022 ?No acute or structural abnormalities  ? ? ?PMFS History: ?Patient Active Problem List  ? Diagnosis Date Noted  ? Bilateral hip pain 01/21/2017  ?  Decreased testosterone level 01/20/2011  ? ?Past Medical History:  ?Diagnosis Date  ? Anxiety   ?  ?Family History  ?Problem Relation Age of Onset  ? Colon cancer Neg Hx   ? Esophageal cancer Neg Hx   ? Rectal cancer Neg Hx   ? Stomach cancer Neg Hx   ?  ?History reviewed. No pertinent surgical history. ?Social History  ? ?Occupational History  ? Not on file  ?Tobacco Use  ? Smoking status: Never  ? Smokeless tobacco: Never  ?Vaping Use  ? Vaping Use: Never used  ?Substance and Sexual Activity  ? Alcohol use: Yes  ?  Comment: beer weekly  ? Drug use: Never  ? Sexual activity: Not on file  ? ? ? ? ? ? ?

## 2022-08-05 ENCOUNTER — Other Ambulatory Visit: Payer: Self-pay | Admitting: Orthopedic Surgery

## 2023-05-13 IMAGING — MR MR ELBOW*R* W/O CM
4 of 5 series · 19 of 40 positions shown · non-contrast
Comparison: Radiographs 07/04/2021

CLINICAL DATA: Right lateral elbow pain for 3 months.

EXAM:
MRI OF THE RIGHT ELBOW WITHOUT CONTRAST
TECHNIQUE: Multiplanar, multisequence MR imaging of the elbow was performed. No
intravenous contrast was administered.

[Series 11: T2 fat-sat · axial · 3.0mm · 0.23mm/px · z∈[-73,+33]mm · 9 of 29 slices shown (1 of 3)]
[im 1/29]
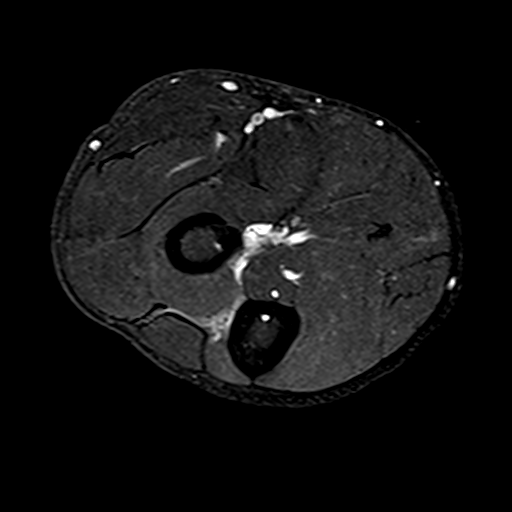
[im 4/29]
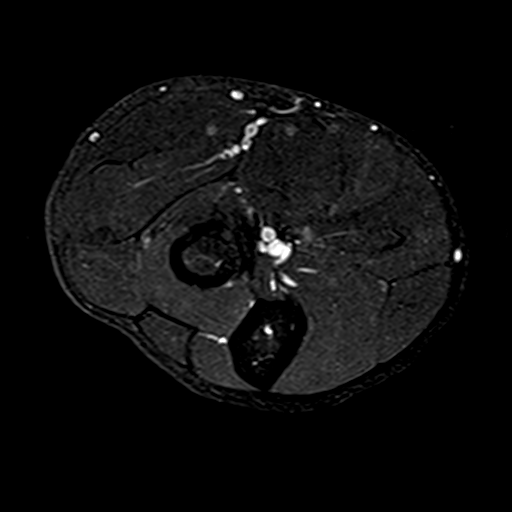
[im 8/29]
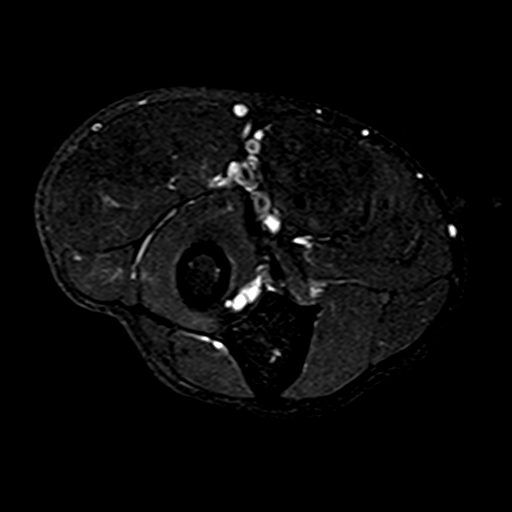
[im 11/29]
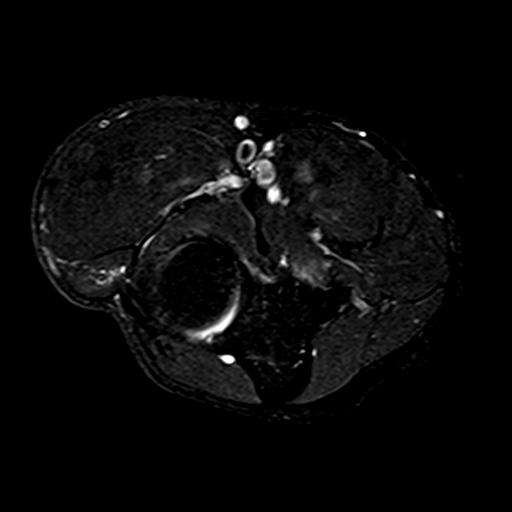
[im 15/29]
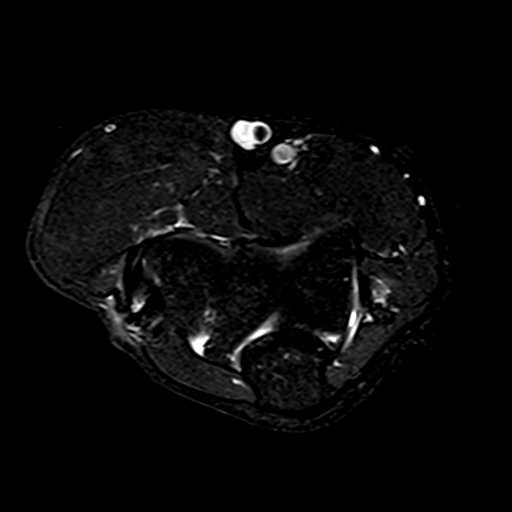
[im 18/29]
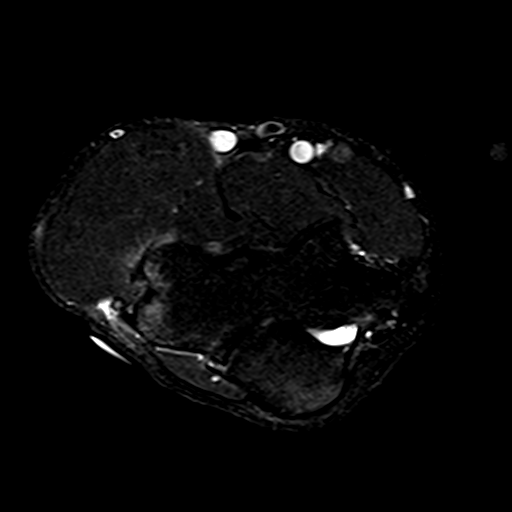
[im 22/29]
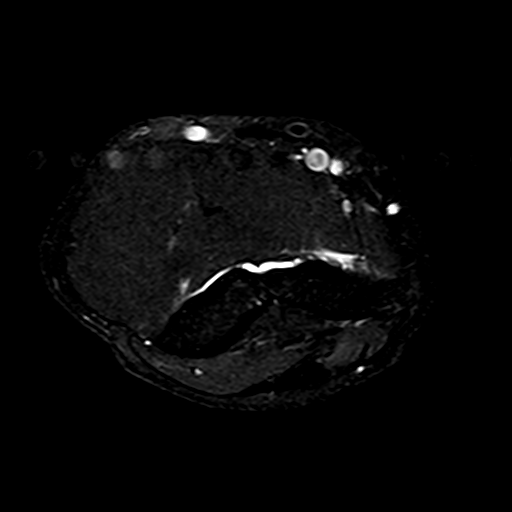
[im 25/29]
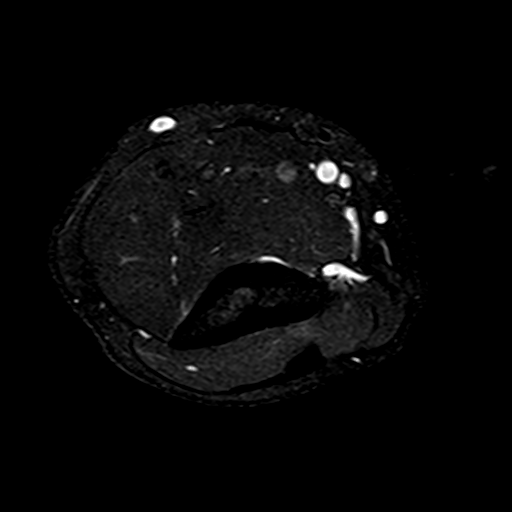
[im 29/29]
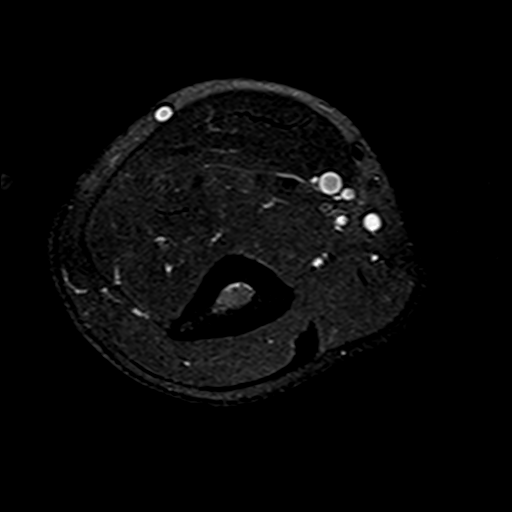

[Series 12: T1 · axial · 3.0mm · 0.23mm/px · z∈[-61,+17]mm · 3 of 29 slices shown]
[im 4/29]
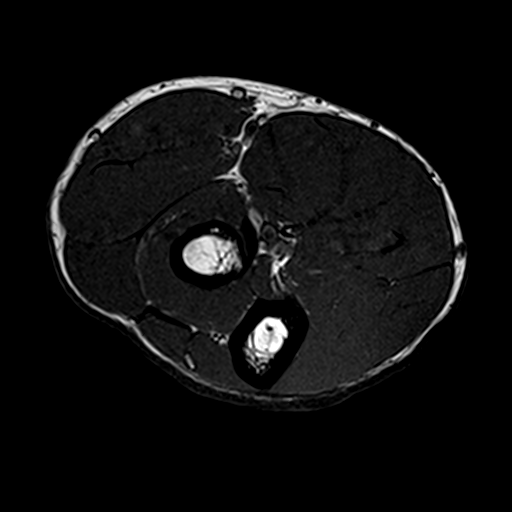
[im 15/29]
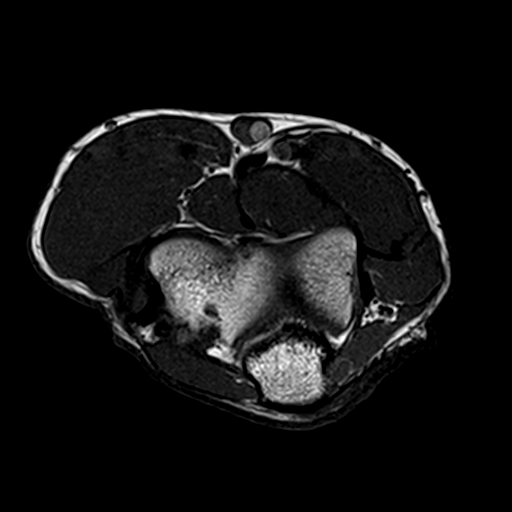
[im 25/29]
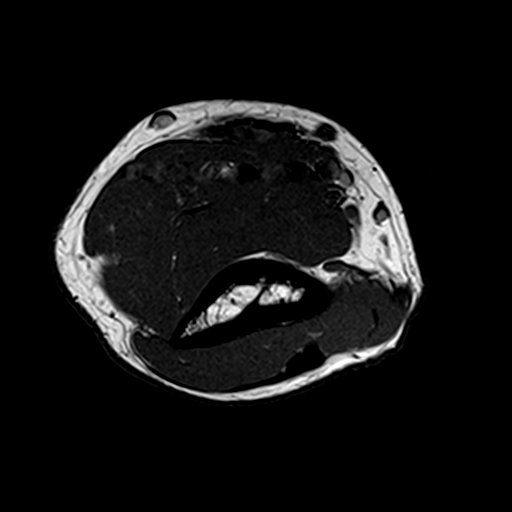

[Series 13: T2 fat-sat · coronal · 3.0mm · 0.27mm/px · 4 of 20 slices shown (2 of 3)]
[im 1/20]
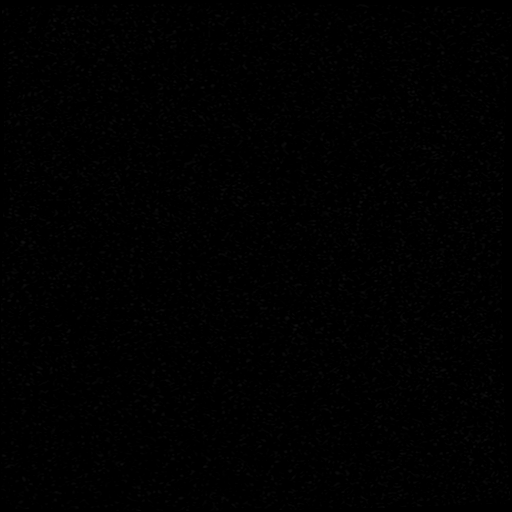
[im 4/20]
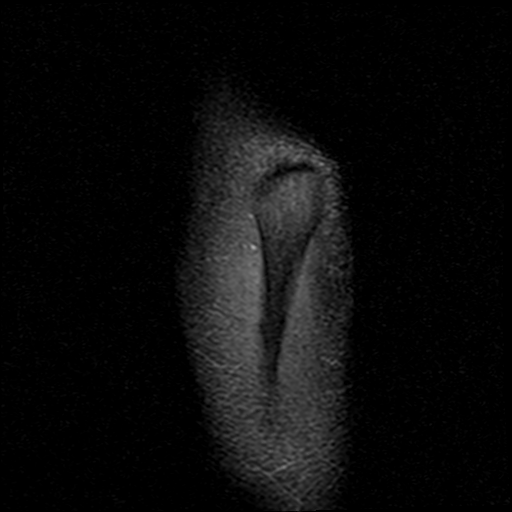
[im 10/20]
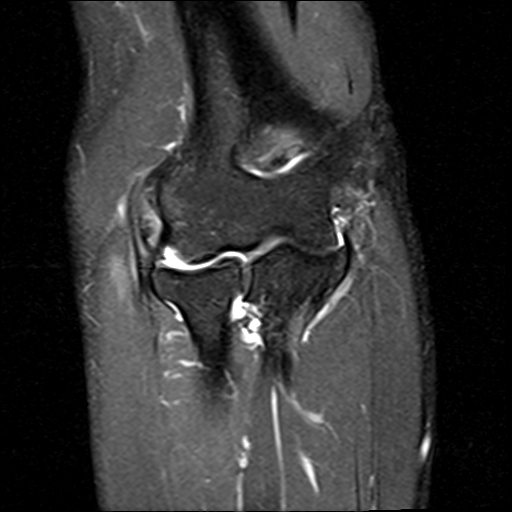
[im 16/20]
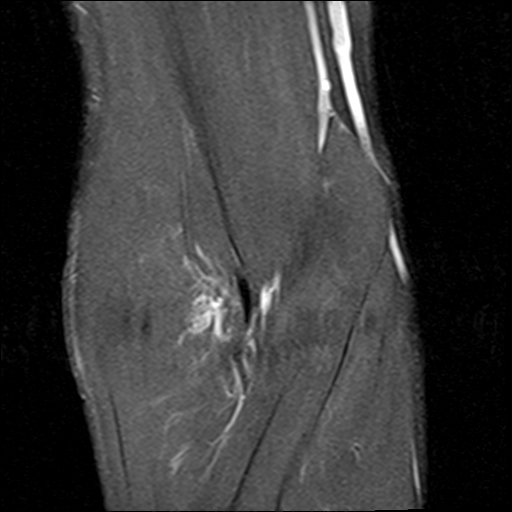

[Series 15: T2 fat-sat · sagittal · 3.0mm · 0.27mm/px · 3 of 23 slices shown (3 of 3)]
[im 4/23]
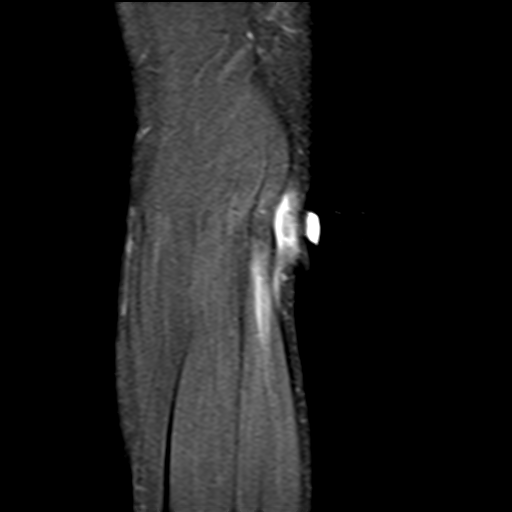
[im 13/23]
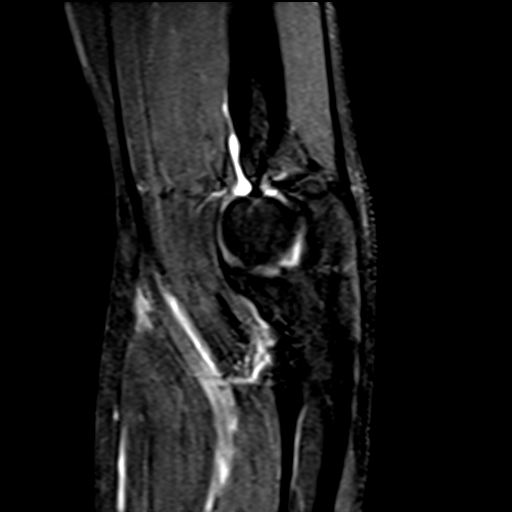
[im 19/23]
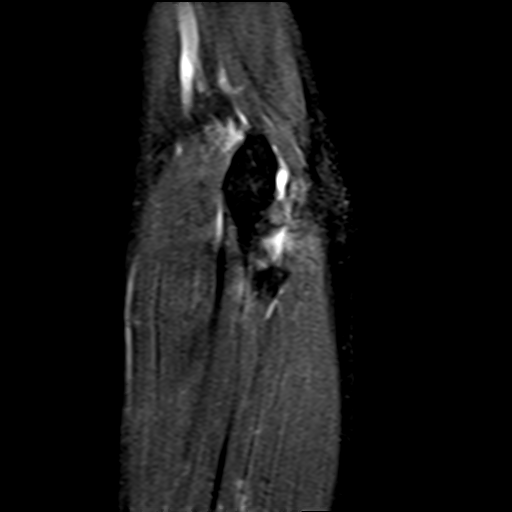

[19 of 40 positions shown; findings below may reference images not displayed]

FINDINGS: TENDONS

Common forearm flexor origin: Intact. Minimal tendinopathy.

Common forearm extensor origin: Significant tendinopathy with
inflammation/edema and interstitial tears. There is also a small
tear involving the deep attachment fibers. Associated reactive
marrow edema involving the lateral epicondyle. There is also some
inflammation/edema and a small amount of fluid extending down into
the extensor digitorum muscle.

Biceps: Intact. The brachialis tendon is also intact.

Triceps: Intact

LIGAMENTS

Medial stabilizers: Intact

Lateral stabilizers:  Intact

Cartilage: The articular cartilage appears intact. No cartilage
defects or osteochondral lesion.

Joint: Small joint effusion. No loose bodies.

Cubital tunnel: Unremarkable appearance of nerve.

Bones: No significant bony findings. Mild reactive marrow edema
involving the lateral epicondyle.
IMPRESSION: 1. Common extensor tendinopathy along interstitial tears and a small
tear involving the deep attachment fibers.
2. Intact medial and lateral collateral ligaments.
3. Small joint effusion.

## 2024-10-27 ENCOUNTER — Telehealth: Payer: Self-pay

## 2024-10-27 NOTE — Telephone Encounter (Signed)
 Attempted to reach patient concerning colonoscopy recall; unable to speak with patient;  left message and number to the office for patient to call back and schedule appts;

## 2024-11-12 ENCOUNTER — Ambulatory Visit: Admitting: Orthopaedic Surgery

## 2024-11-12 ENCOUNTER — Other Ambulatory Visit (INDEPENDENT_AMBULATORY_CARE_PROVIDER_SITE_OTHER): Payer: Self-pay

## 2024-11-12 DIAGNOSIS — M79641 Pain in right hand: Secondary | ICD-10-CM

## 2024-11-12 NOTE — Progress Notes (Signed)
 "  Office Visit Note   Patient: Andre Sullivan           Date of Birth: [birth year]           MRN: [id number] Visit Date: 11/12/2024              Requested by: No referring provider defined for this encounter. PCP: Patient, No Pcp Per   Assessment & Plan: Visit Diagnoses:  1. Pain in right hand     Plan: History of Present Illness Andre Sullivan is a 54 year old male who presents with right hand pain and swelling following a fall 2 weeks ago while running.  Pain and swelling started immediately at the base of the right ring and small fingers, focused at the MCP joints. He describes persistent tightness and discomfort, worse with flexion, and is unable to make a full fist.  He continues to play tennis with a modified grip.   He has not taken NSAIDs or other analgesics, and has used paraffin wax dips and self-directed range of motion exercises. He has not used splinting or buddy taping. He denies prior similar hand injuries.  Physical Exam MUSCULOSKELETAL: right hand: Swelling around the MCP joints of the ring and small fingers. Tenderness of proximal phalanx of small finger.  no clinical malalignments.  Results Radiology Right little finger radiograph: Nondisplaced proximal phalanx fracture without rotational deformity (Independently interpreted)  Assessment and Plan Nondisplaced fracture of the proximal phalanx of the right small finger Stable, nondisplaced fracture.. Continued activity may increase discomfort but not impair healing. Skiing poses risk of traumatic displacement. Ibuprofen  safe for analgesia. - Buddy tape right little finger to ring finger. - Use removable Velcro ulnar gutter splint for stabilization during activities and rest. - Advised caution with skiing due to risk of traumatic displacement. - Encouraged gentle movement and self-directed physical therapy to maintain mobility and reduce swelling. - Swelling may persist 4-6 weeks; fracture healing  expected in 6-8 weeks.  Follow-Up Instructions: Return in about 6 weeks (around 12/24/2024).   Orders:  Orders Placed This Encounter  Procedures   XR Hand Complete Right   No orders of the defined types were placed in this encounter.     Procedures: No procedures performed   Clinical Data: No additional findings.   Subjective: Chief Complaint  Patient presents with   Right Hand - Pain    HPI  Review of Systems   Objective: Vital Signs: There were no vitals taken for this visit.  Physical Exam  Ortho Exam  Specialty Comments:  No specialty comments available.  Imaging: XR Hand Complete Right Result Date: 11/12/2024 X-rays of the right hand show a nondisplaced fracture of proximal phalanx of small finger.    PMFS History: Patient Active Problem List   Diagnosis Date Noted   Bilateral hip pain 01/21/2017   Decreased testosterone level 01/20/2011   Past Medical History:  Diagnosis Date   Anxiety     Family History  Problem Relation Age of Onset   Colon cancer Neg Hx    Esophageal cancer Neg Hx    Rectal cancer Neg Hx    Stomach cancer Neg Hx     No past surgical history on file. Social History   Occupational History   Not on file  Tobacco Use   Smoking status: Never   Smokeless tobacco: Never  Vaping Use   Vaping status: Never Used  Substance and Sexual Activity   Alcohol use: Yes    Comment:  beer weekly   Drug use: Never   Sexual activity: Not on file        "
